# Patient Record
Sex: Female | Born: 1950 | Race: White | Hispanic: No | Marital: Married | Smoking: Never smoker
Health system: Southern US, Community
[De-identification: ages and names within clinical notes are randomized; demographics above are authoritative.]

## PROBLEM LIST (undated history)

## (undated) DIAGNOSIS — R079 Chest pain, unspecified: Secondary | ICD-10-CM

## (undated) DIAGNOSIS — D329 Benign neoplasm of meninges, unspecified: Secondary | ICD-10-CM

## (undated) DIAGNOSIS — D4709 Other mast cell neoplasms of uncertain behavior: Secondary | ICD-10-CM

## (undated) DIAGNOSIS — I10 Essential (primary) hypertension: Secondary | ICD-10-CM

## (undated) DIAGNOSIS — R9430 Abnormal result of cardiovascular function study, unspecified: Secondary | ICD-10-CM

## (undated) DIAGNOSIS — D229 Melanocytic nevi, unspecified: Secondary | ICD-10-CM

## (undated) HISTORY — DX: Other mast cell neoplasms of uncertain behavior: D47.09

## (undated) HISTORY — PX: CRANIOTOMY: SHX93

## (undated) HISTORY — PX: TOTAL ABDOMINAL HYSTERECTOMY: SHX209

## (undated) HISTORY — DX: Essential (primary) hypertension: I10

## (undated) HISTORY — PX: TEAR DUCT PROBING: SHX793

## (undated) HISTORY — DX: Benign neoplasm of meninges, unspecified: D32.9

## (undated) HISTORY — PX: TUBAL LIGATION: SHX77

---
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
## 1998-11-01 ENCOUNTER — Other Ambulatory Visit: Admission: RE | Admit: 1998-11-01 | Discharge: 1998-11-01 | Payer: Self-pay | Admitting: Gynecology

## 2000-01-09 ENCOUNTER — Other Ambulatory Visit: Admission: RE | Admit: 2000-01-09 | Discharge: 2000-01-09 | Payer: Self-pay | Admitting: Gynecology

## 2001-01-17 ENCOUNTER — Other Ambulatory Visit: Admission: RE | Admit: 2001-01-17 | Discharge: 2001-01-17 | Payer: Self-pay | Admitting: Gynecology

## 2003-04-01 ENCOUNTER — Other Ambulatory Visit: Admission: RE | Admit: 2003-04-01 | Discharge: 2003-04-01 | Payer: Self-pay | Admitting: Gynecology

## 2004-10-25 ENCOUNTER — Other Ambulatory Visit: Admission: RE | Admit: 2004-10-25 | Discharge: 2004-10-25 | Payer: Self-pay | Admitting: Gynecology

## 2005-11-14 ENCOUNTER — Other Ambulatory Visit: Admission: RE | Admit: 2005-11-14 | Discharge: 2005-11-14 | Payer: Self-pay | Admitting: Gynecology

## 2006-11-20 ENCOUNTER — Other Ambulatory Visit: Admission: RE | Admit: 2006-11-20 | Discharge: 2006-11-20 | Payer: Self-pay | Admitting: Gynecology

## 2007-11-25 ENCOUNTER — Other Ambulatory Visit: Admission: RE | Admit: 2007-11-25 | Discharge: 2007-11-25 | Payer: Self-pay | Admitting: Gynecology

## 2008-03-04 ENCOUNTER — Ambulatory Visit (HOSPITAL_COMMUNITY): Admission: RE | Admit: 2008-03-04 | Discharge: 2008-03-04 | Payer: Self-pay | Admitting: Gynecology

## 2008-07-01 ENCOUNTER — Encounter: Admission: RE | Admit: 2008-07-01 | Discharge: 2008-07-01 | Payer: Self-pay | Admitting: Gastroenterology

## 2008-10-07 ENCOUNTER — Ambulatory Visit: Payer: Self-pay | Admitting: Oncology

## 2008-10-20 LAB — HYPERCOAGULABLE PANEL, COMPREHENSIVE RET.
AntiThromb III Func: 124 % (ref 76–126)
Anticardiolipin IgA: <7 [APL'U] (ref <13)
Anticardiolipin IgG: <7 [GPL'U] (ref <11)
Anticardiolipin IgM: <7 [MPL'U] (ref <10)
Beta-2-Glycoprotein I IgA: <4 U/mL (ref <10)
Beta-2-Glycoprotein I IgM: <4 U/mL (ref <10)
PTT Lupus Anticoagulant: 42.8 secs (ref 36.3–48.8)
Protein S Activity: 117 % (ref 69–129)

## 2009-06-30 ENCOUNTER — Encounter: Admission: RE | Admit: 2009-06-30 | Discharge: 2009-06-30 | Payer: Self-pay | Admitting: Internal Medicine

## 2011-01-01 ENCOUNTER — Encounter: Payer: Self-pay | Admitting: Family Medicine

## 2012-01-15 DIAGNOSIS — D229 Melanocytic nevi, unspecified: Secondary | ICD-10-CM

## 2012-01-15 HISTORY — DX: Melanocytic nevi, unspecified: D22.9

## 2013-02-13 ENCOUNTER — Other Ambulatory Visit: Payer: Self-pay | Admitting: Interventional Cardiology

## 2013-02-19 ENCOUNTER — Inpatient Hospital Stay (HOSPITAL_BASED_OUTPATIENT_CLINIC_OR_DEPARTMENT_OTHER)
Admission: RE | Admit: 2013-02-19 | Discharge: 2013-02-19 | Disposition: A | Discharge status: Home / Self Care | Payer: Managed Care, Other (non HMO) | Source: Ambulatory Visit | Attending: Interventional Cardiology | Admitting: Interventional Cardiology

## 2013-02-19 ENCOUNTER — Encounter (HOSPITAL_BASED_OUTPATIENT_CLINIC_OR_DEPARTMENT_OTHER)
Admission: RE | Disposition: A | Discharge status: Home / Self Care | Payer: Self-pay | Source: Ambulatory Visit | Attending: Interventional Cardiology

## 2013-02-19 ENCOUNTER — Encounter (HOSPITAL_BASED_OUTPATIENT_CLINIC_OR_DEPARTMENT_OTHER): Payer: Self-pay | Admitting: Interventional Cardiology

## 2013-02-19 DIAGNOSIS — R9430 Abnormal result of cardiovascular function study, unspecified: Secondary | ICD-10-CM | POA: Insufficient documentation

## 2013-02-19 DIAGNOSIS — R079 Chest pain, unspecified: Secondary | ICD-10-CM

## 2013-02-19 DIAGNOSIS — R9439 Abnormal result of other cardiovascular function study: Secondary | ICD-10-CM | POA: Insufficient documentation

## 2013-02-19 HISTORY — DX: Chest pain, unspecified: R07.9

## 2013-02-19 HISTORY — DX: Abnormal result of cardiovascular function study, unspecified: R94.30

## 2013-02-19 SURGERY — JV LEFT HEART CATHETERIZATION WITH CORONARY ANGIOGRAM

## 2013-02-19 MED ORDER — SODIUM CHLORIDE 0.9 % IJ SOLN: 3.0000 mL | Freq: Two times a day (BID) | INTRAMUSCULAR | Status: DC

## 2013-02-19 MED ORDER — ONDANSETRON HCL 4 MG/2ML IJ SOLN
4.0000 mg | Freq: Four times a day (QID) | INTRAMUSCULAR | Status: DC | PRN
Start: 2013-02-19 — End: 2013-02-19

## 2013-02-19 MED ORDER — DIPHENHYDRAMINE HCL 50 MG/ML IJ SOLN
25.0000 mg | Freq: Once | INTRAMUSCULAR | Status: AC
  Administered 2013-02-19: 25 mg via INTRAVENOUS

## 2013-02-19 MED ORDER — FAMOTIDINE IN NACL 20-0.9 MG/50ML-% IV SOLN
20.0000 mg | Freq: Two times a day (BID) | INTRAVENOUS | Status: DC
  Administered 2013-02-19: 20 mg via INTRAVENOUS

## 2013-02-19 MED ORDER — SODIUM CHLORIDE 0.9 % IV SOLN: INTRAVENOUS | Status: DC

## 2013-02-19 MED ORDER — SODIUM CHLORIDE 0.9 % IV SOLN: 250.0000 mL | INTRAVENOUS | Status: DC | PRN

## 2013-02-19 MED ORDER — DIAZEPAM 5 MG PO TABS
5.0000 mg | ORAL_TABLET | ORAL | Status: AC
  Administered 2013-02-19: 5 mg via ORAL

## 2013-02-19 MED ORDER — SODIUM CHLORIDE 0.9 % IJ SOLN: 3.0000 mL | INTRAMUSCULAR | Status: DC | PRN

## 2013-02-19 MED ORDER — ACETAMINOPHEN 325 MG PO TABS: 650.0000 mg | ORAL_TABLET | ORAL | Status: DC | PRN

## 2013-02-19 MED ORDER — ASPIRIN 81 MG PO CHEW
324.0000 mg | CHEWABLE_TABLET | ORAL | Status: AC
  Administered 2013-02-19: 324 mg via ORAL

## 2013-02-19 NOTE — CV Procedure (Addendum)
PROCEDURE:  Left heart catheterization with selective coronary angiography, left ventriculogram. Abdominal aortogram.  INDICATIONS:  Abnormal stress test  The risks, benefits, and details of the procedure were explained to the patient.  The patient verbalized understanding and wanted to proceed.  Informed written consent was obtained.  PROCEDURE TECHNIQUE:  After Xylocaine anesthesia a 75F sheath was placed in the right femoral artery with a single anterior needle wall stick.   Left coronary angiography was done using a Judkins L4 guide catheter.  Right coronary angiography was done using a 3DRC guide catheter.  Left ventriculography was done using a pigtail catheter.    CONTRAST:  Total of 80 cc.  COMPLICATIONS:  None.    HEMODYNAMICS:  Aortic pressure was 135/73; LV pressure was 143/13; LVEDP 15.  There was no gradient between the left ventricle and aorta.    ANGIOGRAPHIC DATA:   The left main coronary artery is widely patent.  The left anterior descending artery is a large vessel which reaches the apex.  There are two diagonal vessels which are medium sized and widely patent.  There is a branch from the proximal LAD that feeds an accessory vessel which apparently has a fistula to the pulmonary artery.  The left circumflex artery is a large vessel which is angiographically normal.  OM1 is large and angiographically normal.  The right coronary artery is large dominant vessel which is angiographically normal.  THere is a large RV marginal branch which apparently feeds a fistula to the pulmonary artery.  LEFT VENTRICULOGRAM:  Left ventricular angiogram was done in the 30 RAO projection and revealed normal left ventricular wall motion and systolic function with an estimated ejection fraction of 60%.  LVEDP was 15 mmHg.  ABDOMINAL AORTOGRAM: No AAA.  Mild aortic atherosclerosis.  Dual arterial supply to the right kidney.  Single left renal artery which is widely  patent.  IMPRESSIONS:  1. Normal left main coronary artery. 2. Normal left anterior descending artery and its branches. 3. Normal left circumflex artery and its branches. 4. Normal right coronary artery. 5. Normal left ventricular systolic function.  LVEDP 15 mmHg.  Ejection fraction 60%. 6.  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    RECOMMENDATION:  Primary prevention.  Will plan for CT angio to evaluate accessory coronary vessels.  If there was significant flow to the PA fistula from the right coronary artery, that could cause angina and possibly an inferior wall defect on stress echo as seen on her stress test.

## 2013-02-19 NOTE — H&P (Signed)
Date of Initial H&P: 02/13/13  History reviewed, patient examined, no change in status, stable for surgery.

## 2013-02-21 ENCOUNTER — Other Ambulatory Visit: Payer: Self-pay | Admitting: Interventional Cardiology

## 2013-02-21 DIAGNOSIS — R079 Chest pain, unspecified: Secondary | ICD-10-CM

## 2013-02-24 ENCOUNTER — Ambulatory Visit (HOSPITAL_COMMUNITY)
Admission: RE | Admit: 2013-02-24 | Discharge: 2013-02-24 | Disposition: A | Discharge status: Home / Self Care | Payer: Managed Care, Other (non HMO) | Source: Ambulatory Visit | Attending: Interventional Cardiology | Admitting: Interventional Cardiology

## 2013-02-24 DIAGNOSIS — R079 Chest pain, unspecified: Secondary | ICD-10-CM

## 2013-02-24 DIAGNOSIS — Q249 Congenital malformation of heart, unspecified: Secondary | ICD-10-CM

## 2013-02-24 DIAGNOSIS — R918 Other nonspecific abnormal finding of lung field: Secondary | ICD-10-CM | POA: Insufficient documentation

## 2013-02-24 MED ORDER — NITROGLYCERIN 0.4 MG SL SUBL
SUBLINGUAL_TABLET | SUBLINGUAL | Status: AC
  Filled 2013-02-24: qty 25

## 2013-02-24 MED ORDER — DIPHENHYDRAMINE HCL 50 MG/ML IJ SOLN
INTRAMUSCULAR | Status: AC
  Filled 2013-02-24: qty 1

## 2013-02-24 MED ORDER — DIPHENHYDRAMINE HCL 50 MG/ML IJ SOLN
25.0000 mg | Freq: Once | INTRAMUSCULAR | Status: AC
  Administered 2013-02-24: 25 mg via INTRAVENOUS

## 2013-02-24 MED ORDER — METOPROLOL TARTRATE 1 MG/ML IV SOLN
INTRAVENOUS | Status: AC
  Administered 2013-02-24: 5 mg
  Filled 2013-02-24: qty 5

## 2013-02-24 MED ORDER — METOPROLOL TARTRATE 1 MG/ML IV SOLN
5.0000 mg | INTRAVENOUS | Status: DC | PRN
  Administered 2013-02-24: 5 mg via INTRAVENOUS

## 2013-02-24 MED ORDER — METHYLPREDNISOLONE SODIUM SUCC 40 MG IJ SOLR
INTRAMUSCULAR | Status: AC
  Filled 2013-02-24: qty 1

## 2013-02-24 MED ORDER — METHYLPREDNISOLONE SODIUM SUCC 40 MG IJ SOLR
30.0000 mg | Freq: Once | INTRAMUSCULAR | Status: AC
  Administered 2013-02-24: 30 mg via INTRAVENOUS

## 2013-02-24 MED ORDER — METOPROLOL TARTRATE 1 MG/ML IV SOLN
INTRAVENOUS | Status: AC
  Administered 2013-02-24: 5 mg via INTRAVENOUS
  Filled 2013-02-24: qty 15

## 2013-02-24 MED ORDER — IOHEXOL 350 MG/ML SOLN
100.0000 mL | Freq: Once | INTRAVENOUS | Status: AC | PRN
  Administered 2013-02-24: 100 mL via INTRAVENOUS

## 2013-02-28 ENCOUNTER — Other Ambulatory Visit: Payer: Self-pay | Admitting: Interventional Cardiology

## 2013-03-03 ENCOUNTER — Inpatient Hospital Stay (HOSPITAL_BASED_OUTPATIENT_CLINIC_OR_DEPARTMENT_OTHER)
Admission: RE | Admit: 2013-03-03 | Discharge: 2013-03-03 | Disposition: A | Discharge status: Home / Self Care | Payer: Managed Care, Other (non HMO) | Source: Ambulatory Visit | Attending: Interventional Cardiology | Admitting: Interventional Cardiology

## 2013-03-03 ENCOUNTER — Encounter (HOSPITAL_BASED_OUTPATIENT_CLINIC_OR_DEPARTMENT_OTHER)
Admission: RE | Disposition: A | Discharge status: Home / Self Care | Payer: Self-pay | Source: Ambulatory Visit | Attending: Interventional Cardiology

## 2013-03-03 DIAGNOSIS — R079 Chest pain, unspecified: Secondary | ICD-10-CM | POA: Insufficient documentation

## 2013-03-03 DIAGNOSIS — R9430 Abnormal result of cardiovascular function study, unspecified: Secondary | ICD-10-CM

## 2013-03-03 DIAGNOSIS — I772 Rupture of artery: Secondary | ICD-10-CM | POA: Insufficient documentation

## 2013-03-03 DIAGNOSIS — R9389 Abnormal findings on diagnostic imaging of other specified body structures: Secondary | ICD-10-CM | POA: Insufficient documentation

## 2013-03-03 DIAGNOSIS — M79609 Pain in unspecified limb: Secondary | ICD-10-CM | POA: Insufficient documentation

## 2013-03-03 LAB — POCT I-STAT 3, VENOUS BLOOD GAS (G3P V)
Acid-base deficit: 1 mmol/L (ref 0.0–2.0)
Acid-base deficit: 1 mmol/L (ref 0.0–2.0)
Acid-base deficit: 3 mmol/L — ABNORMAL HIGH (ref 0.0–2.0)
Bicarbonate: 23.3 mEq/L (ref 20.0–24.0)
Bicarbonate: 23.7 mEq/L (ref 20.0–24.0)
Bicarbonate: 23.9 mEq/L (ref 20.0–24.0)
O2 Saturation: 69 %
O2 Saturation: 72 %
O2 Saturation: 72 %
O2 Saturation: 74 %
O2 Saturation: 75 %
TCO2: 23 mmol/L (ref 0–100)
TCO2: 24 mmol/L (ref 0–100)
TCO2: 25 mmol/L (ref 0–100)
pCO2, Ven: 36.4 mmHg — ABNORMAL LOW (ref 45.0–50.0)
pCO2, Ven: 36.6 mmHg — ABNORMAL LOW (ref 45.0–50.0)
pCO2, Ven: 38.1 mmHg — ABNORMAL LOW (ref 45.0–50.0)
pCO2, Ven: 39 mmHg — ABNORMAL LOW (ref 45.0–50.0)
pH, Ven: 7.395 — ABNORMAL HIGH (ref 7.250–7.300)
pH, Ven: 7.4 — ABNORMAL HIGH (ref 7.250–7.300)
pH, Ven: 7.402 — ABNORMAL HIGH (ref 7.250–7.300)
pO2, Ven: 36 mmHg (ref 30.0–45.0)
pO2, Ven: 38 mmHg (ref 30.0–45.0)
pO2, Ven: 38 mmHg (ref 30.0–45.0)
pO2, Ven: 40 mmHg (ref 30.0–45.0)

## 2013-03-03 SURGERY — JV RIGHT HEART CATHETERIZATION
Anesthesia: Moderate Sedation

## 2013-03-03 MED ORDER — SODIUM CHLORIDE 0.9 % IJ SOLN: 3.0000 mL | Freq: Two times a day (BID) | INTRAMUSCULAR | Status: DC

## 2013-03-03 MED ORDER — ACETAMINOPHEN 325 MG PO TABS: 650.0000 mg | ORAL_TABLET | ORAL | Status: DC | PRN

## 2013-03-03 MED ORDER — SODIUM CHLORIDE 0.9 % IJ SOLN: 3.0000 mL | INTRAMUSCULAR | Status: DC | PRN

## 2013-03-03 MED ORDER — SODIUM CHLORIDE 0.9 % IV SOLN: 250.0000 mL | INTRAVENOUS | Status: DC | PRN

## 2013-03-03 MED ORDER — ONDANSETRON HCL 4 MG/2ML IJ SOLN: 4.0000 mg | Freq: Four times a day (QID) | INTRAMUSCULAR | Status: DC | PRN

## 2013-03-03 NOTE — OR Nursing (Signed)
Dr Varanasi at bedside to discuss results and treatment plan with pt and family 

## 2013-03-03 NOTE — H&P (Signed)
  Date of Initial H&P: 02/13/13  History reviewed, patient examined, no change in status, stable for surgery.

## 2013-03-03 NOTE — OR Nursing (Signed)
Meal served 

## 2013-03-03 NOTE — CV Procedure (Signed)
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  The patient verbalized understanding and wanted to proceed.  Informed written consent was obtained.  PROCEDURE TECHNIQUE:  After Xylocaine anesthesia a 95F sheath was placed in the right femoral vein with a single anterior needle wall stick.   A Swan-Ganz catheter was used to obtain a hemodynamic pressures assessment as well as multiple different oxygen saturation samples from the right atrium, right ventricle, main pulmonary artery, left pulmonary artery and right pulmonary artery.    CONTRAST:  Total of 0 cc.  COMPLICATIONS:  None.    HEMODYNAMICS:  Right atrial pressure 8/6, mean radiation pressure 6 mm Hg; RV pressure 34/2, RVEDP 7 mm Hg; pulmonary artery 32/11, mean pulmonary artery pressure 20 mm Hg, pulmonary capillary wedge pressure 14/11, mean pulmonary capillary wedge pressure 10 mm Hg, main pulmonary artery saturation 73%, right pulmonary artery saturation 74%, left pulmonary artery saturation 74%, right atrial saturation 73%, right ventricle saturation 69%.  Cardiac output 4.8 L per minute.  Cardiac index 2.4.    IMPRESSIONS:  1. 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 2.  No evidence of pulmonary hypertension.  RECOMMENDATION:  Will send her test results to Dr. Romeo Apple at [facility].  She did have an abnormal stress echo with an inferior wall motion abnormality done there at [facility].  We'll have to make a decision whether or not the success rate arteries will need to be ligated.  At rest, there does not seem to be any significant shunt.

## 2013-03-03 NOTE — OR Nursing (Signed)
Tegaderm dressing applied, site level 0, bedrest begins at 1215 

## 2013-06-05 ENCOUNTER — Ambulatory Visit (INDEPENDENT_AMBULATORY_CARE_PROVIDER_SITE_OTHER): Payer: Managed Care, Other (non HMO) | Admitting: Internal Medicine

## 2013-06-05 ENCOUNTER — Encounter: Payer: Self-pay | Admitting: Internal Medicine

## 2013-06-05 VITALS — BP 150/98 | HR 98 | Temp 98.1°F | Ht 68.5 in | Wt 201.8 lb

## 2013-06-05 DIAGNOSIS — R0600 Dyspnea, unspecified: Secondary | ICD-10-CM

## 2013-06-05 DIAGNOSIS — R911 Solitary pulmonary nodule: Secondary | ICD-10-CM

## 2013-06-05 DIAGNOSIS — R0609 Other forms of dyspnea: Secondary | ICD-10-CM

## 2013-06-05 NOTE — Progress Notes (Signed)
Subjective:    Patient ID: April Summers, female    DOB: 1950/12/13, 62 y.o.   MRN: [id number] PCP FRIED, Doris Cheadle, MD Cards is Everette Rank  HPI 62 year old female. Never smoker. Her mom is Loura Halt patient with amio toxicity of Dr Marchelle Gearing   IN 2010 dx meningioma x  3. S/p resection x 1 in Jan 2011. Rest two; on observation    Says in  Nov 2013: while in an MRI suite at [facility] for known meniongioma she developed tachycaria. She then realized she had hx of flushing x 6 years. 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 And started on cromolyn and benadryl. Then in March 2014 underwent cardiac cath (had failed cardiac stress test months earlire) by Eldridge Dace [place] and  this showed co artery anamoly. Therefore had CT coronary CT chest 02/24/13 here in [place] and the lung compomnt showed pulmonary nodule 4mm in RML (see below). So, she called her Dr Reginia Naas at [facility] who is a GI oncologist who specializes in carcinoids and was told she needed pulmonary evalation   Currnetly only resp symptom is dyspne x 1 year. Insidiius onset, STable.Ass with fatigue . SHe feels is due to mast cell activation. ORal cromolyn not helping with dyspnea though it helps with flushing and tachycadia. She is not interested in dyspnea workup   Of note, through all this diagnosed in April 2014 with thyrpid nodules x 3 on each side. Currently on monitoring with plans for possible needle aspiration   Findings:  IMPRESSION:  1. 4 mm nodule in the lateral segment of the right middle lobe.  This is a nonspecific finding. If the patient is at high risk for  bronchogenic carcinoma, follow-up chest CT at 1 year is  recommended. If the patient is at low risk, no follow-up is  needed. This recommendation follows the consensus statement:  Guidelines for Management of Small Pulmonary Nodules Detected on CT  Scans: A Statement from  the [employer] Society as published in  Radiology 2005; 237:395-400.  2. Calcified granuloma in the right lower lobe incidentally noted.  **END ADDENDUM** SIGNED BY: Florencia Reasons, M.D     Past Medical History  Diagnosis Date  . Nonspecific abnormal unspecified cardiovascular function study   . Chest pain, unspecified   . HTN (hypertension)   . Mast cell disorder   . Benign meningioma      Family History  Problem Relation Age of Onset  . Heart disease Father   . Clotting disorder Brother      History   Social History  . Marital Status: Married    Spouse Name: N/A    Number of Children: N/A  . Years of Education: N/A   Occupational History  . Not on file.   Social History Main Topics  . Smoking status: Never Smoker   . Smokeless tobacco: Not on file  . Alcohol Use: No  . Drug Use: No  . Sexually Active: Not on file   Other Topics Concern  . Not on file   Social History Narrative  . No narrative on file     Allergies  Allergen Reactions  . Lidocaine Hcl Palpitations  . Blue Dyes (Parenteral)     Mouth blisters  . Ciprofloxacin Other (See Comments)    Wheezing  . Doxycycline Other (See Comments)    Burning feeling all over  . Imdur (  Isosorbide)     Long acting causes migraines  . Nitrostat (Nitroglycerin)     Long acting cause migraines  . Red Dye     Mouth blisters  . Benzocaine Palpitations     No outpatient prescriptions prior to visit.   No facility-administered medications prior to visit.       Review of Systems  Constitutional: Negative for fever and unexpected weight change.  HENT: Negative for ear pain, nosebleeds, congestion, sore throat, rhinorrhea, sneezing, trouble swallowing, dental problem, postnasal drip and sinus pressure.   Eyes: Negative for redness and itching.  Respiratory: Positive for shortness of breath. Negative for cough, chest tightness and wheezing.   Cardiovascular: Negative for palpitations and leg swelling.   Gastrointestinal: Negative for nausea and vomiting.  Genitourinary: Negative for dysuria.  Musculoskeletal: Negative for joint swelling.  Skin: Negative for rash.  Neurological: Negative for headaches.  Hematological: Does not bruise/bleed easily.  Psychiatric/Behavioral: Negative for dysphoric mood. The patient is not nervous/anxious.        Objective:   Physical Exam  Vitals reviewed. Constitutional: She is oriented to person, place, and time. She appears well-developed and well-nourished. No distress.  HENT:  Head: Normocephalic and atraumatic.  Right Ear: External ear normal.  Left Ear: External ear normal.  Mouth/Throat: Oropharynx is clear and moist. No oropharyngeal exudate.  Eyes: Conjunctivae and EOM are normal. Pupils are equal, round, and reactive to light. Right eye exhibits no discharge. Left eye exhibits no discharge. No scleral icterus.  Neck: Normal range of motion. Neck supple. No JVD present. No tracheal deviation present. No thyromegaly present.  Cardiovascular: Normal rate, regular rhythm, normal heart sounds and intact distal pulses.  Exam reveals no gallop and no friction rub.   No murmur heard. Pulmonary/Chest: Effort normal and breath sounds normal. No respiratory distress. She has no wheezes. She has no rales. She exhibits no tenderness.  Abdominal: Soft. Bowel sounds are normal. She exhibits no distension and no mass. There is no tenderness. There is no rebound and no guarding.  Musculoskeletal: Normal range of motion. She exhibits no edema and no tenderness.  Lymphadenopathy:    She has no cervical adenopathy.  Neurological: She is alert and oriented to person, place, and time. She has normal reflexes. No cranial nerve deficit. She exhibits normal muscle tone. Coordination normal.  Skin: Skin is warm and dry. No rash noted. She is not diaphoretic. There is erythema. No pallor.  Psychiatric: She has a normal mood and affect. Her behavior is normal. Judgment  and thought content normal.          Assessment & Plan:

## 2013-06-05 NOTE — Patient Instructions (Addendum)
#  Shortness of breath  - will watch it for now - if bothersome or worrisome or worse, call and return sooner  #Lung ndoule - 4mm RML in [facility] 2014  - Repeat scan in 1 year which is [facility] 2015  #Followup - March 2015 after CT chest

## 2013-06-08 DIAGNOSIS — R0600 Dyspnea, unspecified: Secondary | ICD-10-CM | POA: Insufficient documentation

## 2013-06-08 DIAGNOSIS — R911 Solitary pulmonary nodule: Secondary | ICD-10-CM | POA: Insufficient documentation

## 2013-06-08 NOTE — Assessment & Plan Note (Signed)
She has exertional dyspnea in setting of mast cell activation disorder but she is not interested in workup

## 2013-06-08 NOTE — Assessment & Plan Note (Signed)
RML 4mm nodule Non smoker. Seen Coronory CT March 2014. Discussed ddx. Plan repeat CT chest March 2015

## 2014-02-27 ENCOUNTER — Other Ambulatory Visit: Payer: Managed Care, Other (non HMO)

## 2014-03-17 ENCOUNTER — Encounter (HOSPITAL_BASED_OUTPATIENT_CLINIC_OR_DEPARTMENT_OTHER): Payer: Self-pay

## 2014-03-17 ENCOUNTER — Ambulatory Visit (HOSPITAL_BASED_OUTPATIENT_CLINIC_OR_DEPARTMENT_OTHER)
Admission: RE | Admit: 2014-03-17 | Discharge: 2014-03-17 | Disposition: A | Discharge status: Home / Self Care | Payer: Managed Care, Other (non HMO) | Source: Ambulatory Visit | Attending: Internal Medicine | Admitting: Internal Medicine

## 2014-03-17 ENCOUNTER — Other Ambulatory Visit: Payer: Self-pay

## 2014-03-17 DIAGNOSIS — R911 Solitary pulmonary nodule: Secondary | ICD-10-CM

## 2014-03-25 ENCOUNTER — Telehealth: Payer: Self-pay | Admitting: Internal Medicine

## 2014-03-25 NOTE — Telephone Encounter (Signed)
Ct chest 03/17/14 stable nodule.. AGain she was supposed to see me after Ct ni march 2015 but there is no fu office visit   Dr. Brand Males, M.D., [facility].C.P Pulmonary and Critical Care Medicine Staff Physician [facility] Pager: [phone], If no answer or between  15:00h - 7:00h: call [phone]  03/25/2014 9:36 PM

## 2014-03-26 NOTE — Telephone Encounter (Signed)
Are there any days on your schedule that we can double book you? You are 100% for the next 3-4 weeks.  Pt aware that we will contact her once we have an appt date and time that will work  Please advise Dr Chase Caller. Thanks.

## 2014-03-27 NOTE — Telephone Encounter (Signed)
Spoke with pt and offered pt appt today to f/u on chest ct.  Pt states she cannot come today.  Appt given on 04/28/14 at 9:45 with MR.  Pt requested copy of chest ct be mailed to her home so she can take this to her appt at [facility].  Address verified and copy mailed to pt.

## 2014-03-27 NOTE — Telephone Encounter (Signed)
  First available whenever that is is fine

## 2014-04-28 ENCOUNTER — Ambulatory Visit: Payer: Managed Care, Other (non HMO) | Admitting: Internal Medicine

## 2014-05-12 ENCOUNTER — Encounter: Payer: Self-pay | Admitting: Internal Medicine

## 2014-05-12 ENCOUNTER — Ambulatory Visit (INDEPENDENT_AMBULATORY_CARE_PROVIDER_SITE_OTHER): Payer: Managed Care, Other (non HMO) | Admitting: Internal Medicine

## 2014-05-12 VITALS — BP 132/72 | HR 69 | Ht 68.0 in | Wt 201.4 lb

## 2014-05-12 DIAGNOSIS — R0609 Other forms of dyspnea: Secondary | ICD-10-CM

## 2014-05-12 DIAGNOSIS — R911 Solitary pulmonary nodule: Secondary | ICD-10-CM

## 2014-05-12 DIAGNOSIS — R0989 Other specified symptoms and signs involving the circulatory and respiratory systems: Secondary | ICD-10-CM

## 2014-05-12 DIAGNOSIS — R0600 Dyspnea, unspecified: Secondary | ICD-10-CM

## 2014-05-12 NOTE — Patient Instructions (Addendum)
#  Shortness of breath  -glad is resolved  #Lung ndoule  - 50mm RML/RUL in [facility] 2014 and unchanged in April 2015  - new nodule seen 66mm in Right middle lobe  - Repeat scan in 2 year which is [facility] 2017  #Followup - March 2017 after CT chest

## 2014-05-12 NOTE — Progress Notes (Signed)
Subjective:    Patient ID: April Summers, female    DOB: 1951-08-20, 63 y.o.   MRN: [id number]  HPI    OV 05/12/2014  Chief Complaint  Patient presents with  . Follow-up    Pt here to discuss CT results. Denies cough, SOB and CP. Pt overall is doing well.     Followup pulmonary nodule:  4 mm right upper lobe/right middle lobe. Had CT scan of the chest April 2015. This nodule is no unchanged compared to one year ago in 2014. In fact it is noncalcified. However she is a new 2 mm right middle lobe pulmonary nodule. I advised that she does not need follow up for this but if she is anxious about this/ She prefers a followup CT scan of the chest in 2 years. She is a nonsmoker. Her dyspnea is resolved and there are no new issues. She is completely asymptomatic.   COMPARISON: CT HEART MORP W/CTA COR W/SCORE W/CA W/CM &/OR WO/CM  dated 02/24/2013  FINDINGS:  There is a 4 mm calcified lung nodule in the anterior inferior  aspect of the right upper lobe. It is stable from 02/24/2013. It is  associated with minimal surrounding fibrosis. This is seen best on  image number 37. On 49 in the right middle lobe there is a 2 mm  pulmonary nodule which is not noted on the prior study. The right  lung is otherwise clear. Left lung is clear except for minimal  subsegmental atelectasis in the inferolateral lingula.  No pleural or pericardial effusion. No significant adenopathy.  Minimal aortic arch calcification. Scans through the upper abdomen  unremarkable. No acute osseous abnormalities.  IMPRESSION:  Stable 4 mm pulmonary nodule, with 12 month stability suggesting  benign etiology, a conclusion further supported by the calcification  of the nodule. There is a new 2 mm nodule on the right. If the  patient is at high risk for bronchogenic carcinoma, follow-up chest  CT at 1 year for this new nodule is recommended. If the patient is  at low risk, no follow-up is needed. This recommendation follows  the  consensus statement: Guidelines for Management of Small Pulmonary  Nodules Detected on CT Scans: A Statement from the [employer] as published in Radiology 2005; 237:395-400.  Electronically Signed  By: Skipper Cliche M.D.  On: 03/17/2014 10:14   Review of Systems  Constitutional: Negative for fever and unexpected weight change.  HENT: Negative for congestion, dental problem, ear pain, nosebleeds, postnasal drip, rhinorrhea, sinus pressure, sneezing, sore throat and trouble swallowing.   Eyes: Negative for redness and itching.  Respiratory: Negative for cough, chest tightness, shortness of breath and wheezing.   Cardiovascular: Negative for palpitations and leg swelling.  Gastrointestinal: Negative for nausea and vomiting.  Genitourinary: Negative for dysuria.  Musculoskeletal: Negative for joint swelling.  Skin: Negative for rash.  Neurological: Negative for headaches.  Hematological: Does not bruise/bleed easily.  Psychiatric/Behavioral: Negative for dysphoric mood. The patient is not nervous/anxious.        Objective:   Physical Exam  Vitals reviewed. Constitutional: She is oriented to person, place, and time. She appears well-developed and well-nourished. No distress.  HENT:  Head: Normocephalic and atraumatic.  Right Ear: External ear normal.  Left Ear: External ear normal.  Mouth/Throat: Oropharynx is clear and moist. No oropharyngeal exudate.  Eyes: Conjunctivae and EOM are normal. Pupils are equal, round, and reactive to light. Right eye exhibits no discharge. Left eye exhibits no discharge. No scleral  icterus.  Neck: Normal range of motion. Neck supple. No JVD present. No tracheal deviation present. No thyromegaly present.  Cardiovascular: Normal rate, regular rhythm, normal heart sounds and intact distal pulses.  Exam reveals no gallop and no friction rub.   No murmur heard. Pulmonary/Chest: Effort normal and breath sounds normal. No respiratory distress.  She has no wheezes. She has no rales. She exhibits no tenderness.  Abdominal: Soft. Bowel sounds are normal. She exhibits no distension and no mass. There is no tenderness. There is no rebound and no guarding.  Musculoskeletal: Normal range of motion. She exhibits no edema and no tenderness.  Lymphadenopathy:    She has no cervical adenopathy.  Neurological: She is alert and oriented to person, place, and time. She has normal reflexes. No cranial nerve deficit. She exhibits normal muscle tone. Coordination normal.  Skin: Skin is warm and dry. No rash noted. She is not diaphoretic. No erythema. No pallor.  Psychiatric: She has a normal mood and affect. Her behavior is normal. Judgment and thought content normal.          Assessment & Plan:  #Shortness of breath  -glad is resolved  #Lung ndoule  - 65mm RML/RUL in [facility] 2014 and unchanged in April 2015  - new nodule seen 61mm in Right middle lobe  - Repeat scan in 2 year which is [facility] 2017  #Followup - March 2017 after CT chest

## 2014-05-22 NOTE — Assessment & Plan Note (Signed)
#  Shortness of breath  -glad is resolved

## 2014-05-22 NOTE — Assessment & Plan Note (Signed)
#  Lung ndoule  - 47mm RML/RUL in [facility] 2014 and unchanged in April 2015  - new nodule seen 15mm in Right middle lobe  - Repeat scan in 2 year which is [facility] 2017  #Followup - March 2017 after CT chest

## 2014-06-02 ENCOUNTER — Other Ambulatory Visit: Payer: Self-pay | Admitting: Physician Assistant

## 2014-09-08 ENCOUNTER — Encounter: Payer: Self-pay | Admitting: *Deleted

## 2015-08-19 IMAGING — CT CT CHEST W/O CM
2 of 3 series · 15 of 36 positions shown, 18 images · non-contrast
Comparison: CT HEART MORP W/CTA COR W/SCORE W/CA W/CM &/OR WO/CM
dated 02/24/2013

CLINICAL DATA: 4 mm right lung nodule

EXAM:
CT CHEST WITHOUT CONTRAST
TECHNIQUE: Multidetector CT imaging of the chest was performed following the
standard protocol without IV contrast.

[Series 2: chest 5.0 b31f · axial · 0.77mm/px · z∈[-111,+139]mm · 12 of 60 slices shown, 15 images]
[im 5/60  mediastinal]
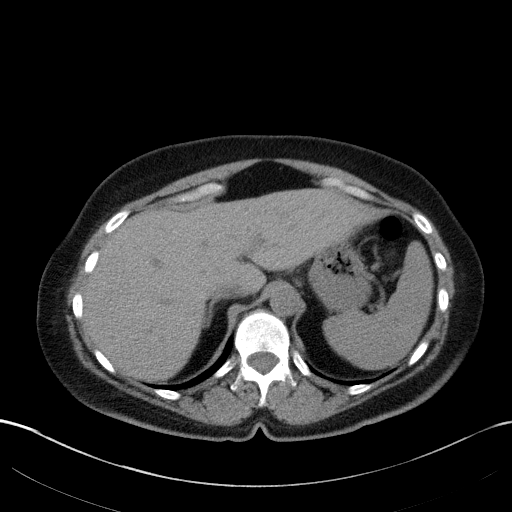
[im 5/60  lung]
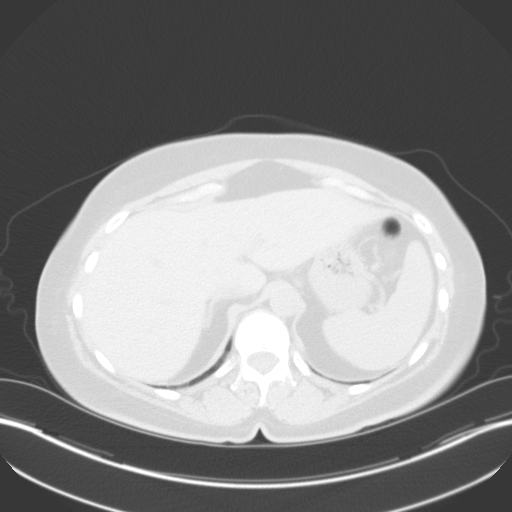
[im 9/60  lung]
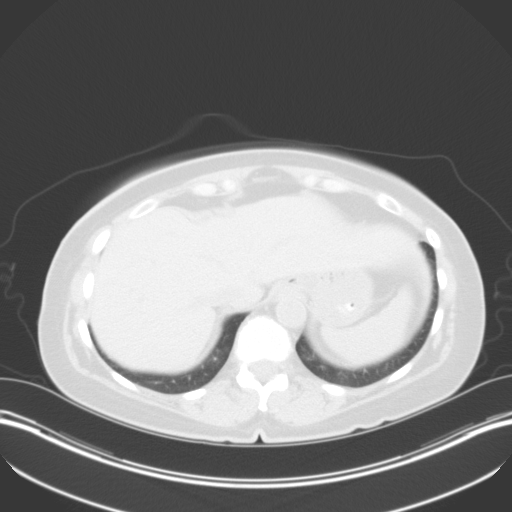
[im 14/60  lung]
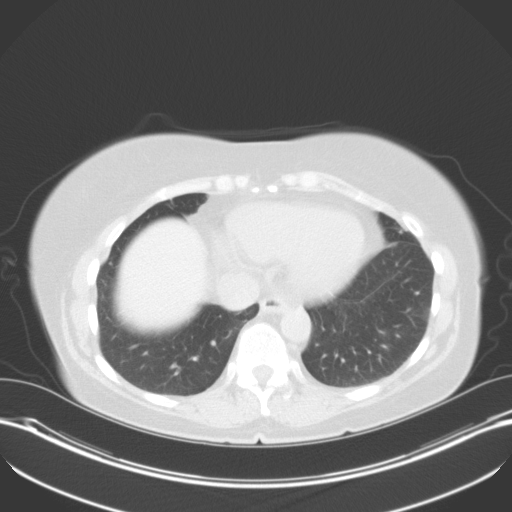
[im 18/60  lung]
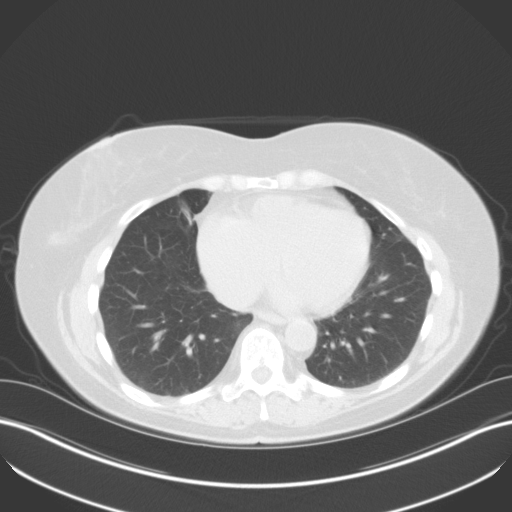
[im 22/60  mediastinal]
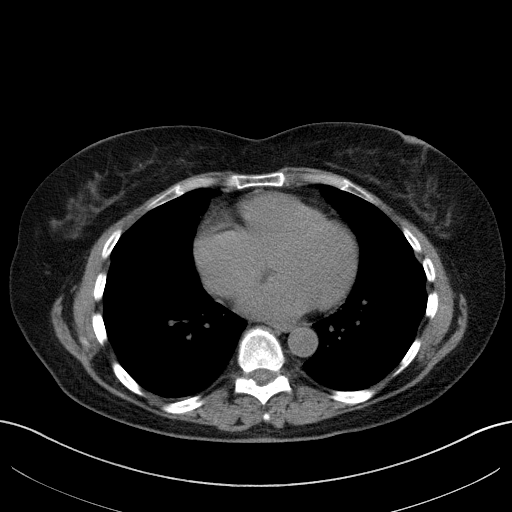
[im 22/60  lung]
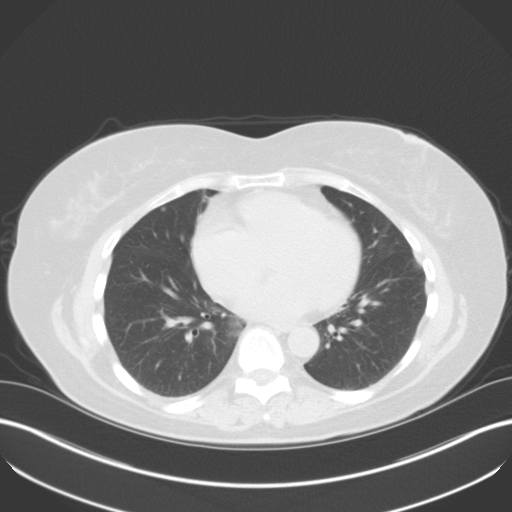
[im 27/60  lung]
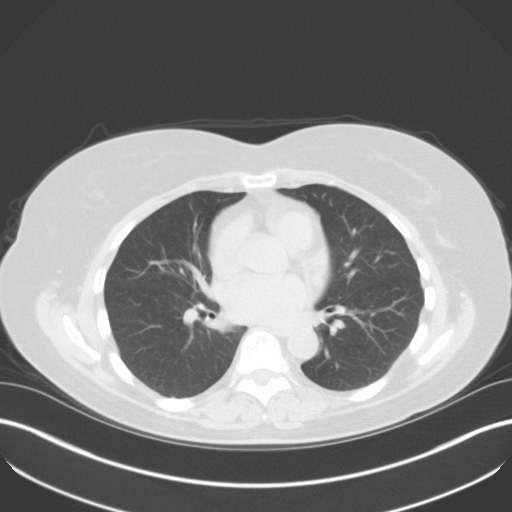
[im 33/60  lung]
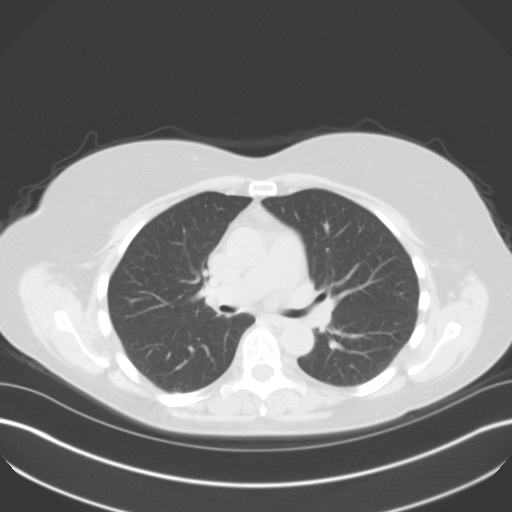
[im 38/60  lung]
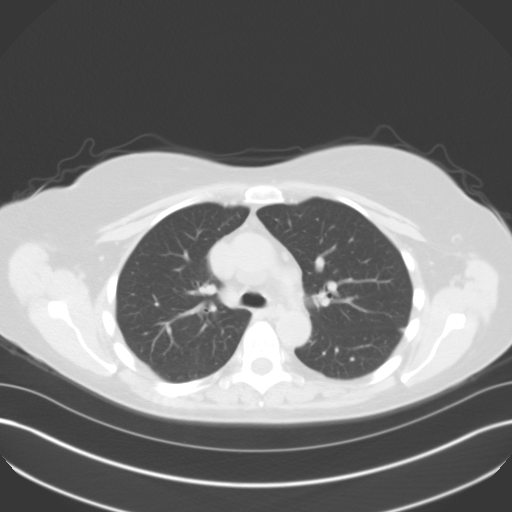
[im 42/60  mediastinal]
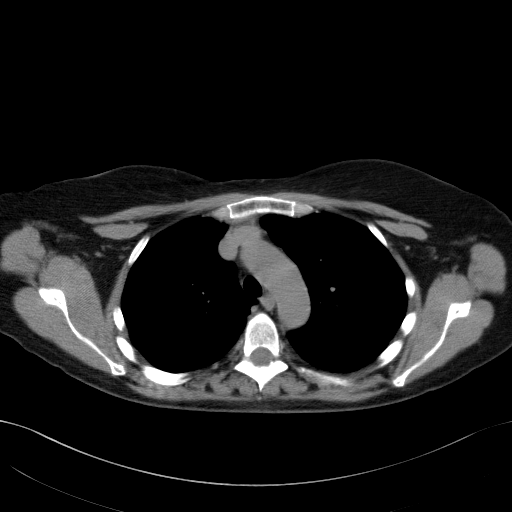
[im 42/60  lung]
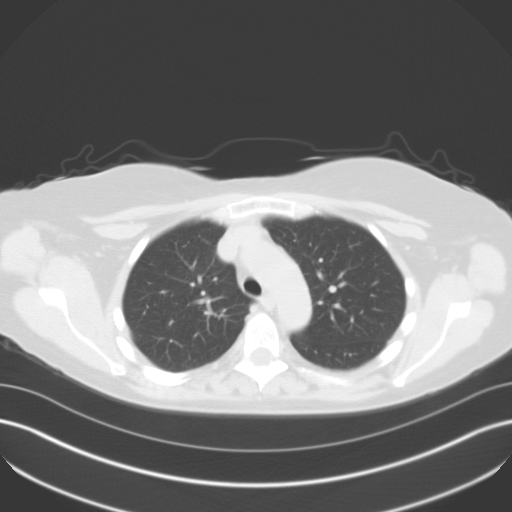
[im 46/60  lung]
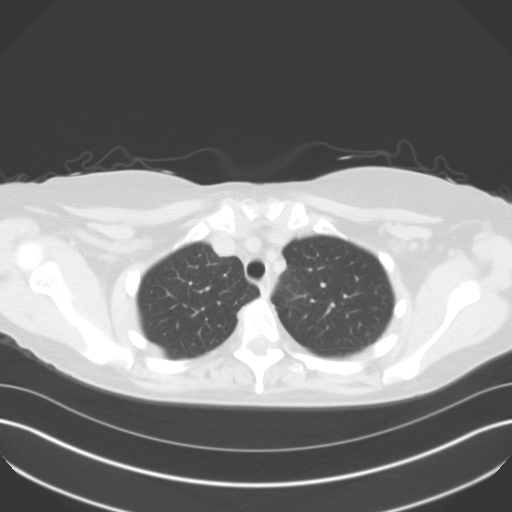
[im 51/60  lung]
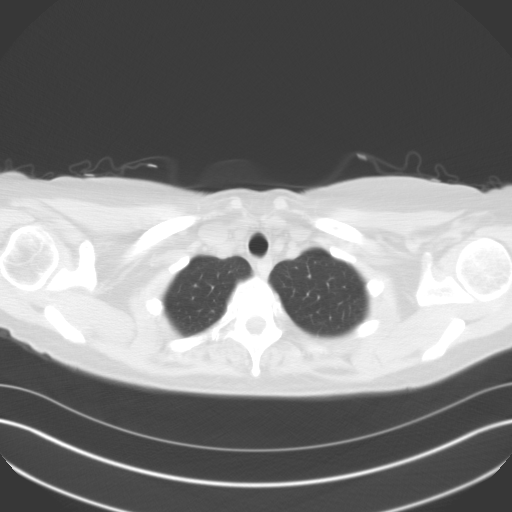
[im 55/60  lung]
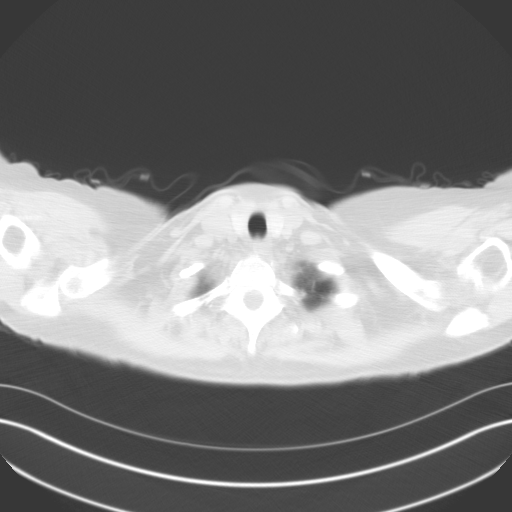

[Series 6: chest 3.0 coronal · coronal · 0.59mm/px · 3 of 83 slices shown]
[im 17/83  lung]
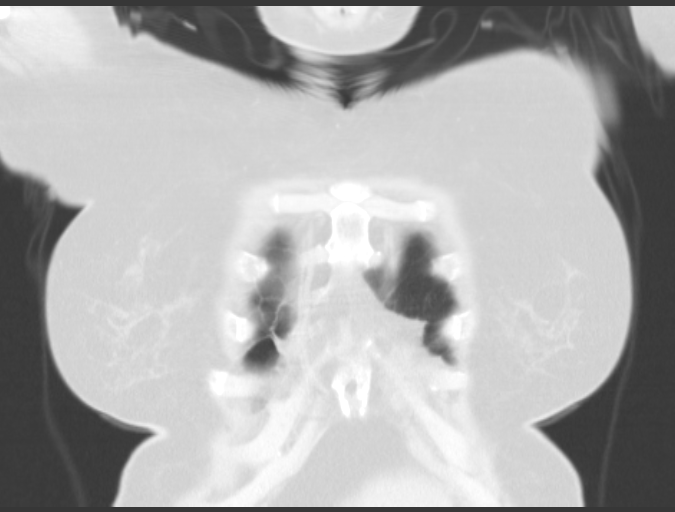
[im 33/83  lung]
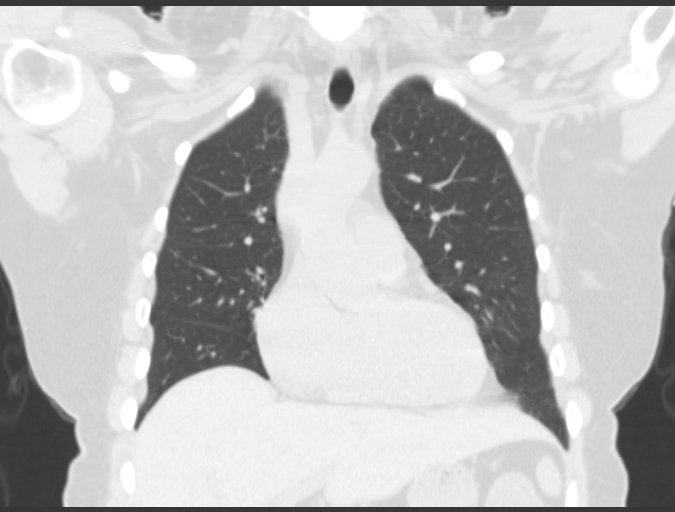
[im 50/83  lung]
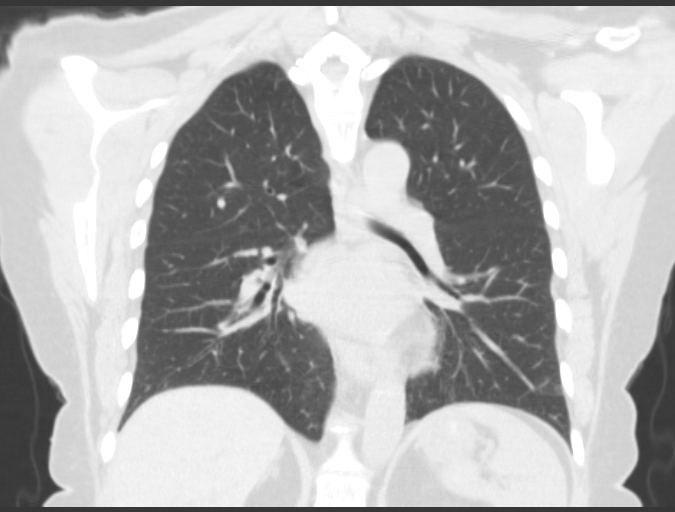

[15 of 36 positions shown; findings below may reference images not displayed]

FINDINGS: There is a 4 mm calcified lung nodule in the anterior inferior
aspect of the right upper lobe. It is stable from 02/24/2013. It is
associated with minimal surrounding fibrosis. This is seen best on
image number 37. On 39 in the right middle lobe there is a 2 mm
pulmonary nodule which is not noted on the prior study. The right
lung is otherwise clear. Left lung is clear except for minimal
subsegmental atelectasis in the inferolateral lingula.

No pleural or pericardial effusion. No significant adenopathy.
Minimal aortic arch calcification. Scans through the upper abdomen
unremarkable. No acute osseous abnormalities.
IMPRESSION: Stable 4 mm pulmonary nodule, with 12 month stability suggesting
benign etiology, a conclusion further supported by the calcification
of the nodule. There is a new 2 mm nodule on the right. If the
patient is at high risk for bronchogenic carcinoma, follow-up chest
CT at 1 year for this new nodule is recommended. If the patient is
at low risk, no follow-up is needed. This recommendation follows the
consensus statement: Guidelines for Management of Small Pulmonary
Nodules Detected on CT Scans: A Statement from the [employer]

## 2016-01-26 ENCOUNTER — Other Ambulatory Visit: Payer: Self-pay | Admitting: Internal Medicine

## 2016-01-26 DIAGNOSIS — R911 Solitary pulmonary nodule: Secondary | ICD-10-CM

## 2016-01-27 ENCOUNTER — Telehealth: Payer: Self-pay | Admitting: Internal Medicine

## 2016-01-27 NOTE — Telephone Encounter (Signed)
atc # provided X3, line would not ring, no fast busy signal, just silence X30 seconds, then line cut off.   Wcb.

## 2016-01-28 ENCOUNTER — Other Ambulatory Visit: Payer: Managed Care, Other (non HMO)

## 2016-01-28 NOTE — Telephone Encounter (Signed)
atc number provided X2, line would not go through. Attempted to dial [phone] instead of [phone] in case this was a typo- call could not go through as dialed.  Wcb.

## 2016-03-06 ENCOUNTER — Telehealth: Payer: Self-pay | Admitting: Internal Medicine

## 2016-03-06 NOTE — Telephone Encounter (Signed)
Called and spoke to pt. Informed her of the results per MR. Pt verbalized understanding and denied any further questions or concerns at this time.  

## 2016-03-06 NOTE — Telephone Encounter (Signed)
I am seeing her 05/04/16. Got reports from [facility] CT chest done 02/09/16 that RUL lun gnodule is unchanged since 2014 and considered bneign. And anohter one 2mm RML unchanged since 2015 and also benign

## 2016-04-24 ENCOUNTER — Encounter: Payer: Self-pay | Admitting: *Deleted

## 2016-04-28 ENCOUNTER — Ambulatory Visit: Payer: Managed Care, Other (non HMO) | Admitting: Interventional Cardiology

## 2016-05-04 ENCOUNTER — Encounter: Payer: Self-pay | Admitting: Internal Medicine

## 2016-05-04 ENCOUNTER — Ambulatory Visit (INDEPENDENT_AMBULATORY_CARE_PROVIDER_SITE_OTHER): Payer: Managed Care, Other (non HMO) | Admitting: Internal Medicine

## 2016-05-04 VITALS — BP 116/78 | HR 81 | Ht 68.5 in | Wt 192.0 lb

## 2016-05-04 DIAGNOSIS — R911 Solitary pulmonary nodule: Secondary | ICD-10-CM | POA: Diagnosis not present

## 2016-05-04 NOTE — Patient Instructions (Signed)
ICD-9-CM ICD-10-CM   1. Nodule of right lung 793.11 R91.1    Stable < 60mm lung nodule for years ASymptomatic No active followup Return as needed

## 2016-05-04 NOTE — Progress Notes (Signed)
Subjective:     Patient ID: April Summers, female   DOB: 1951-08-24, 65 y.o.   MRN: [id number]  HPI     OV 05/12/2014  Chief Complaint  Patient presents with  . Follow-up    Pt here to discuss CT results. Denies cough, SOB and CP. Pt overall is doing well.     Followup pulmonary nodule:  4 mm right upper lobe/right middle lobe. Had CT scan of the chest April 2015. This nodule is no unchanged compared to one year ago in 2014. In fact it is noncalcified. However she is a new 2 mm right middle lobe pulmonary nodule. I advised that she does not need follow up for this but if she is anxious about this/ She prefers a followup CT scan of the chest in 2 years. She is a nonsmoker. Her dyspnea is resolved and there are no new issues. She is completely asymptomatic.   COMPARISON: CT HEART MORP W/CTA COR W/SCORE W/CA W/CM &/OR WO/CM  dated 02/24/2013  FINDINGS:  There is a 4 mm calcified lung nodule in the anterior inferior  aspect of the right upper lobe. It is stable from 02/24/2013. It is  associated with minimal surrounding fibrosis. This is seen best on  image number 37. On 66 in the right middle lobe there is a 2 mm  pulmonary nodule which is not noted on the prior study. The right  lung is otherwise clear. Left lung is clear except for minimal  subsegmental atelectasis in the inferolateral lingula.  No pleural or pericardial effusion. No significant adenopathy.  Minimal aortic arch calcification. Scans through the upper abdomen  unremarkable. No acute osseous abnormalities.  IMPRESSION:  Stable 4 mm pulmonary nodule, with 12 month stability suggesting  benign etiology, a conclusion further supported by the calcification  of the nodule. There is a new 2 mm nodule on the right. If the  patient is at high risk for bronchogenic carcinoma, follow-up chest  CT at 1 year for this new nodule is recommended. If the patient is  at low risk, no follow-up is needed. This recommendation follows the   consensus statement: Guidelines for Management of Small Pulmonary  Nodules Detected on CT Scans: A Statement from the [place] as published in Radiology 2005; 237:395-400.  Electronically Signed  By: Skipper Cliche M.D.  On: 03/17/2014 10:14   OV 05/04/2016  Chief Complaint  Patient presents with  . Follow-up    Denies any current SOB. Review CT.    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 She has less than 4 mm nodules. She is a nonsmoker. She wanted a 2 year follow-up CT chest just to be sure that the lung nodules are not growing. She had this in the [place]. This is on by [facility]. I do not have the formal film with me but it was read by [facility] radiologist and the report is as below. I was not able to visualized image. She is asymptomatic. She went to [facility] in July 2016 to see Dr. Achilles Dunk for mass cell disorder which she believes is stable.      Got reports from [facility] CT chest done 02/09/16 that RUL lun gnodule is unchanged since 2014 and considered bneign. And anohter one 46mm RML unchanged since 2015 and also benign  EXAM:  CT CHEST WITHOUT CONTRAST    TECHNIQUE:  Multidetector CT imaging of the chest was performed following the  standard protocol without IV contrast.  COMPARISON:Chest CT 03/17/2014 and 02/24/2013    FINDINGS:  Mediastinum/Nodes: No breast masses, supraclavicular or axillary  lymphadenopathy. Small scattered lymph nodes are noted. The heart is  normal in size. No pericardial effusion. The aorta is normal in  caliber. No significant atherosclerotic calcifications.    No mediastinal or hilar mass or adenopathy. Small scattered lymph  nodes are stable. The esophagus is grossly normal.    Lungs/Pleura: Stable linear calcified density in the right lower  lobe on image number 39. There is also a stable 4 mm subpleural  right lower lobe pulmonary nodule on image number 39. No new   pulmonary lesions. No acute pulmonary findings. No pleural effusion.  Minimal lingular scarring changes.    Upper abdomen: No significant findings.    Musculoskeletal: No significant findings. Multilevel Schmorl's nodes  are noted in the thoracic spine.    IMPRESSION:  1. Stable linear calcified density in the right upper lobe,  unchanged since 2014 and considered benign.  2. Stable 4 mm subpleural pulmonary nodule in the right middle lobe  unchanged since 2015 and also considered benign.  3. No acute pulmonary findings.  4. No mediastinal or hilar mass or lymphadenopathy.      Electronically Signed  ByMarijo Sanes M.D.  On: 02/09/2016 11:56    has a past medical history of Nonspecific abnormal unspecified cardiovascular function study; Chest pain, unspecified; HTN (hypertension); Mast cell disorder; and Benign meningioma ([place]).   reports that she has never smoked. She does not have any smokeless tobacco history on file.  Past Surgical History  Procedure Laterality Date  . Craniotomy    . Tear duct probing    . Total abdominal hysterectomy    . Tubal ligation      Allergies  Allergen Reactions  . Lidocaine Hcl Palpitations  . Blue Dyes (Parenteral)     Mouth blisters  . Ciprofloxacin Other (See Comments)    Wheezing  . Doxycycline Other (See Comments)    Burning feeling all over  . Imdur [Isosorbide Nitrate]     Long acting causes migraines  . Nitrostat [Nitroglycerin]     Long acting cause migraines  . Red Dye     Mouth blisters  . Benzocaine Palpitations    Immunization History  Administered Date(s) Administered  . Influenza Split 09/10/2013    Family History  Problem Relation Age of Onset  . Heart disease Father   . Clotting disorder Brother   . Heart attack Father   . Hypertension Father   . Stroke Maternal Grandfather      Current outpatient prescriptions:  .  carvedilol (COREG) 3.125 MG tablet, Take 3.125 mg by  mouth 2 (two) times daily with a meal., Disp: , Rfl:     Review of Systems     Objective:   Physical Exam Filed Vitals:   05/04/16 1417  BP: 116/78  Pulse: 81  Height: 5' 8.5" (1.74 m)  Weight: 192 lb (87.091 kg)  SpO2: 100%   Focal exam - discussio main -oriented x 3. Normal gait. Pleasant . CTA [place]. Normal heart sounds     Assessment:       ICD-9-CM ICD-10-CM   1. Nodule of right lung 793.11 R91.1        Plan:      Stable < 34mm lung nodule for years ASymptomatic No active followup Return as needed   Greater than 50% of this brief 10 minutes visit spent in counseling and face-to-face discussion and coordination of care  Dr. Brand Males, M.D., [facility].C.P Pulmonary and Critical Care Medicine Staff Physician [facility] Pager: [phone], If no answer or between  15:00h - 7:00h: call [phone]  05/04/2016 2:32 PM

## 2016-06-27 ENCOUNTER — Ambulatory Visit (INDEPENDENT_AMBULATORY_CARE_PROVIDER_SITE_OTHER): Payer: Managed Care, Other (non HMO) | Admitting: Interventional Cardiology

## 2016-06-27 ENCOUNTER — Encounter: Payer: Self-pay | Admitting: *Deleted

## 2016-06-27 ENCOUNTER — Encounter: Payer: Self-pay | Admitting: Interventional Cardiology

## 2016-06-27 ENCOUNTER — Encounter (INDEPENDENT_AMBULATORY_CARE_PROVIDER_SITE_OTHER): Payer: Self-pay

## 2016-06-27 VITALS — BP 150/97 | HR 80 | Ht 68.5 in | Wt 197.0 lb

## 2016-06-27 DIAGNOSIS — Q245 Malformation of coronary vessels: Secondary | ICD-10-CM

## 2016-06-27 DIAGNOSIS — R002 Palpitations: Secondary | ICD-10-CM

## 2016-06-27 DIAGNOSIS — I4949 Other premature depolarization: Secondary | ICD-10-CM

## 2016-06-27 NOTE — Progress Notes (Signed)
Patient ID: April Summers, female   DOB: 01/18/1951, 65 y.o.   MRN: [id number] Patient enrolled for [employer] to mail a Verite 30 day cardiac event monitor to the home.

## 2016-06-27 NOTE — Patient Instructions (Signed)
Medication Instructions:  Same-no changes  Labwork: None  Testing/Procedures: Your physician has recommended that you wear a 30 day event monitor. Event monitors are medical devices that record the heart's electrical activity. Doctors most often [place] these monitors to diagnose arrhythmias. Arrhythmias are problems with the speed or rhythm of the heartbeat. The monitor is a small, portable device. You can wear one while you do your normal daily activities. This is usually used to diagnose what is causing palpitations/syncope (passing out).   Follow-Up: Your physician recommends that you schedule a follow-up appointment in: based on results of event monitor.     If you need a refill on your cardiac medications before your next appointment, please call your pharmacy.

## 2016-06-27 NOTE — Progress Notes (Signed)
Cardiology Office Note   Date:  06/27/2016   ID:  April Summers, DOB 08-04-1951, MRN [id number]  PCP:  Orpah Melter, MD    No chief complaint on file.    Wt Readings from Last 3 Encounters:  06/27/16 197 lb (89.359 kg)  05/04/16 192 lb (87.091 kg)  05/12/14 201 lb 6.4 oz (91.354 kg)       History of Present Illness: April Summers is a 65 y.o. 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 She has been managed medically. Overall, she had been doing well.  Over the last few months, she has noticed that when she works outside, her heart rate will increase and it takes a very long time for it to come back down to baseline. Sometimes it will be several hours for heart rate comes back to baseline. Her heart rate can be stuck anywhere from the 90s to 110 range. This occurs even if she lies down after activity. Typically, trimming edges where she is using her arms will stimulate this heart rate. She denies any chest pain. She still feels isolated premature beats. One such beat occurred during the exam today and she noticed it. She also has strings of these beats that lasts anywhere from 5-7 seconds. No lightheadedness or syncope.    Past Medical History  Diagnosis Date  . Nonspecific abnormal unspecified cardiovascular function study   . Chest pain, unspecified   . HTN (hypertension)   . Mast cell disorder   . Benign meningioma [facility])     Past Surgical History  Procedure Laterality Date  . Craniotomy    . Tear duct probing    . Total abdominal hysterectomy    . Tubal ligation       Current Outpatient Prescriptions  Medication Sig Dispense Refill  . Acetylcysteine (NAC) 500 MG CAPS Take 500 mg by mouth 2 (two) times daily.    . Artificial Tear Solution (TEARS NATURALE OP) Apply 1 drop to eye 2 (two) times daily.     . Cholecalciferol 1000 UNT/0.03ML LIQD Take 1 tablet by mouth daily.       . diphenhydrAMINE (BENADRYL) 25 MG tablet Take 25 mg by mouth 3 (three) times daily.    . famotidine (PEPCID) 20 MG tablet Take 20 mg by mouth at bedtime.     Marland Kitchen HYDROcodone-acetaminophen (NORCO/VICODIN) 5-325 MG tablet Take 1 tablet by mouth every 6 (six) hours as needed for severe pain.     Marland Kitchen KETOTIFEN FUMARATE OP Apply 1 mg to eye 2 (two) times daily.    . traMADol (ULTRAM) 50 MG tablet Take 50 mg by mouth as needed for moderate pain or severe pain.     . Sodium Chloride-Sodium Bicarb 1.57 g PACK Reported on 06/27/2016     No current facility-administered medications for this visit.    Allergies:   Blue dyes (parenteral); Cimetidine; Ciprofloxacin; Doxycycline; Imdur; Lidocaine; Lidocaine hcl; Nitrostat; Nsaids; Red dye; Fluocinolone; and Benzocaine    Social History:  The patient  reports that she has never smoked. She does not have any smokeless tobacco history on file. She reports that she does not drink alcohol or use illicit drugs.   Family History:  The patient's family history includes Clotting disorder in her brother; Heart attack in her father; Heart disease in her father; Hypertension in her father; Stroke in her maternal grandfather.    ROS:  Please see the history  of present illness.   Otherwise, review of systems are positive for [place] improved.   All other systems are reviewed and negative.    PHYSICAL EXAM: VS:  BP 150/97 mmHg  Pulse 80  Ht 5' 8.5" (1.74 m)  Wt 197 lb (89.359 kg)  BMI 29.51 kg/m2 , BMI Body mass index is 29.51 kg/(m^2). GEN: Well nourished, well developed, in no acute distress HEENT: normal Neck: no JVD, carotid bruits, or masses Cardiac: RRR; no murmurs, rubs, or gallops,no edema  Respiratory:  clear to auscultation bilaterally, normal work of breathing GI: soft, nontender, nondistended, + BS MS: no deformity or atrophy Skin: warm and dry, no rash Neuro:  Strength and sensation are intact Psych: euthymic mood, full affect   EKG:   The ekg  ordered today demonstrates normal sinus rhythm, RSR prime, no ST segment changes   Recent Labs: No results found for requested labs within last 365 days.   Lipid Panel No results found for: CHOL, TRIG, HDL, CHOLHDL, VLDL, LDLCALC, LDLDIRECT   Other studies Reviewed: Additional studies/ records that were reviewed today with results demonstrating: prior cath report reviewed.   ASSESSMENT AND PLAN:  1. Palpitations: Plan for 30 day event monitor to further delineate whether her palpitations are related to PVCs or a more sustained arrhythmia. She states there is some variability to the heart rate even when it is going fast after exercise. This makes it less likely that it is some type of SVT. 2. Premature beats: She recognizes these symptoms quite well. 3. Known coronary artery fistula. She was evaluated at [facility] and medical therapy was recommended. I don't think this is contributing to her palpitations.   Current medicines are reviewed at length with the patient today.  The patient concerns regarding her medicines were addressed.  The following changes have been made:  No change  Labs/ tests ordered today include:  No orders of the defined types were placed in this encounter.    Recommend 150 minutes/week of aerobic exercise Low fat, low carb, high fiber diet recommended  Disposition:   FU in post monitor   Signed, Larae Grooms, MD  06/27/2016 2:58 PM    [address], [place], [place]  [address] Phone: [phone]; Fax: [phone]

## 2016-07-04 NOTE — Addendum Note (Signed)
Addended by: Freada Bergeron on: 07/04/2016 05:44 PM   Modules accepted: Orders

## 2016-07-06 NOTE — Addendum Note (Signed)
Addended by: Freada Bergeron on: 07/06/2016 12:44 PM   Modules accepted: Orders

## 2018-03-18 DIAGNOSIS — G43909 Migraine, unspecified, not intractable, without status migrainosus: Secondary | ICD-10-CM | POA: Diagnosis not present

## 2018-03-18 DIAGNOSIS — Z5181 Encounter for therapeutic drug level monitoring: Secondary | ICD-10-CM | POA: Diagnosis not present

## 2018-03-18 DIAGNOSIS — M1990 Unspecified osteoarthritis, unspecified site: Secondary | ICD-10-CM | POA: Diagnosis not present

## 2018-09-17 DIAGNOSIS — G43909 Migraine, unspecified, not intractable, without status migrainosus: Secondary | ICD-10-CM | POA: Diagnosis not present

## 2018-09-17 DIAGNOSIS — D8940 Mast cell activation, unspecified: Secondary | ICD-10-CM | POA: Diagnosis not present

## 2018-09-17 DIAGNOSIS — M1990 Unspecified osteoarthritis, unspecified site: Secondary | ICD-10-CM | POA: Diagnosis not present

## 2018-09-17 DIAGNOSIS — M5430 Sciatica, unspecified side: Secondary | ICD-10-CM | POA: Diagnosis not present

## 2019-03-18 DIAGNOSIS — G43909 Migraine, unspecified, not intractable, without status migrainosus: Secondary | ICD-10-CM | POA: Diagnosis not present

## 2019-03-18 DIAGNOSIS — G894 Chronic pain syndrome: Secondary | ICD-10-CM | POA: Diagnosis not present

## 2019-08-14 DIAGNOSIS — G894 Chronic pain syndrome: Secondary | ICD-10-CM | POA: Diagnosis not present

## 2019-08-14 DIAGNOSIS — Z1211 Encounter for screening for malignant neoplasm of colon: Secondary | ICD-10-CM | POA: Diagnosis not present

## 2019-08-14 DIAGNOSIS — Z0000 Encounter for general adult medical examination without abnormal findings: Secondary | ICD-10-CM | POA: Diagnosis not present

## 2019-08-14 DIAGNOSIS — Z13220 Encounter for screening for lipoid disorders: Secondary | ICD-10-CM | POA: Diagnosis not present

## 2019-08-14 DIAGNOSIS — G44229 Chronic tension-type headache, not intractable: Secondary | ICD-10-CM | POA: Diagnosis not present

## 2019-08-14 DIAGNOSIS — E7800 Pure hypercholesterolemia, unspecified: Secondary | ICD-10-CM | POA: Diagnosis not present

## 2019-08-14 DIAGNOSIS — Z1231 Encounter for screening mammogram for malignant neoplasm of breast: Secondary | ICD-10-CM | POA: Diagnosis not present

## 2019-09-17 ENCOUNTER — Encounter: Payer: Self-pay | Admitting: *Deleted

## 2020-12-24 DIAGNOSIS — M25561 Pain in right knee: Secondary | ICD-10-CM | POA: Diagnosis not present

## 2021-01-07 DIAGNOSIS — M1990 Unspecified osteoarthritis, unspecified site: Secondary | ICD-10-CM | POA: Diagnosis not present

## 2021-01-07 DIAGNOSIS — Z0000 Encounter for general adult medical examination without abnormal findings: Secondary | ICD-10-CM | POA: Diagnosis not present

## 2021-01-07 DIAGNOSIS — R059 Cough, unspecified: Secondary | ICD-10-CM | POA: Diagnosis not present

## 2021-01-07 DIAGNOSIS — G894 Chronic pain syndrome: Secondary | ICD-10-CM | POA: Diagnosis not present

## 2021-03-04 DIAGNOSIS — I129 Hypertensive chronic kidney disease with stage 1 through stage 4 chronic kidney disease, or unspecified chronic kidney disease: Secondary | ICD-10-CM | POA: Diagnosis not present

## 2021-03-04 DIAGNOSIS — I10 Essential (primary) hypertension: Secondary | ICD-10-CM | POA: Diagnosis not present

## 2021-03-04 DIAGNOSIS — M1990 Unspecified osteoarthritis, unspecified site: Secondary | ICD-10-CM | POA: Diagnosis not present

## 2021-03-28 DIAGNOSIS — I10 Essential (primary) hypertension: Secondary | ICD-10-CM | POA: Diagnosis not present

## 2021-03-28 DIAGNOSIS — I129 Hypertensive chronic kidney disease with stage 1 through stage 4 chronic kidney disease, or unspecified chronic kidney disease: Secondary | ICD-10-CM | POA: Diagnosis not present

## 2021-03-28 DIAGNOSIS — M1990 Unspecified osteoarthritis, unspecified site: Secondary | ICD-10-CM | POA: Diagnosis not present

## 2021-07-14 DIAGNOSIS — G8929 Other chronic pain: Secondary | ICD-10-CM | POA: Diagnosis not present

## 2021-07-14 DIAGNOSIS — M2550 Pain in unspecified joint: Secondary | ICD-10-CM | POA: Diagnosis not present

## 2021-08-10 DIAGNOSIS — G8929 Other chronic pain: Secondary | ICD-10-CM | POA: Diagnosis not present

## 2021-08-10 DIAGNOSIS — I10 Essential (primary) hypertension: Secondary | ICD-10-CM | POA: Diagnosis not present

## 2021-08-10 DIAGNOSIS — I129 Hypertensive chronic kidney disease with stage 1 through stage 4 chronic kidney disease, or unspecified chronic kidney disease: Secondary | ICD-10-CM | POA: Diagnosis not present

## 2021-08-10 DIAGNOSIS — M1990 Unspecified osteoarthritis, unspecified site: Secondary | ICD-10-CM | POA: Diagnosis not present

## 2022-01-09 DIAGNOSIS — M1990 Unspecified osteoarthritis, unspecified site: Secondary | ICD-10-CM | POA: Diagnosis not present

## 2022-01-09 DIAGNOSIS — G894 Chronic pain syndrome: Secondary | ICD-10-CM | POA: Diagnosis not present

## 2022-01-09 DIAGNOSIS — Z0000 Encounter for general adult medical examination without abnormal findings: Secondary | ICD-10-CM | POA: Diagnosis not present

## 2022-01-09 DIAGNOSIS — G43909 Migraine, unspecified, not intractable, without status migrainosus: Secondary | ICD-10-CM | POA: Diagnosis not present

## 2022-02-28 DIAGNOSIS — D320 Benign neoplasm of cerebral meninges: Secondary | ICD-10-CM | POA: Diagnosis not present

## 2022-02-28 DIAGNOSIS — Z79899 Other long term (current) drug therapy: Secondary | ICD-10-CM | POA: Diagnosis not present

## 2022-02-28 DIAGNOSIS — R519 Headache, unspecified: Secondary | ICD-10-CM | POA: Diagnosis not present

## 2022-02-28 DIAGNOSIS — Z923 Personal history of irradiation: Secondary | ICD-10-CM | POA: Diagnosis not present

## 2022-07-10 DIAGNOSIS — R030 Elevated blood-pressure reading, without diagnosis of hypertension: Secondary | ICD-10-CM | POA: Diagnosis not present

## 2022-07-10 DIAGNOSIS — G894 Chronic pain syndrome: Secondary | ICD-10-CM | POA: Diagnosis not present

## 2023-01-09 DIAGNOSIS — G894 Chronic pain syndrome: Secondary | ICD-10-CM | POA: Diagnosis not present

## 2023-08-21 DIAGNOSIS — I10 Essential (primary) hypertension: Secondary | ICD-10-CM | POA: Diagnosis not present

## 2023-08-21 DIAGNOSIS — D329 Benign neoplasm of meninges, unspecified: Secondary | ICD-10-CM | POA: Diagnosis not present

## 2023-08-21 DIAGNOSIS — G894 Chronic pain syndrome: Secondary | ICD-10-CM | POA: Diagnosis not present

## 2023-08-21 DIAGNOSIS — G43909 Migraine, unspecified, not intractable, without status migrainosus: Secondary | ICD-10-CM | POA: Diagnosis not present

## 2023-08-21 DIAGNOSIS — Z0000 Encounter for general adult medical examination without abnormal findings: Secondary | ICD-10-CM | POA: Diagnosis not present

## 2024-01-28 DIAGNOSIS — E034 Atrophy of thyroid (acquired): Secondary | ICD-10-CM | POA: Diagnosis not present

## 2024-01-28 DIAGNOSIS — E042 Nontoxic multinodular goiter: Secondary | ICD-10-CM | POA: Diagnosis not present

## 2024-02-26 DIAGNOSIS — G43909 Migraine, unspecified, not intractable, without status migrainosus: Secondary | ICD-10-CM | POA: Diagnosis not present

## 2024-02-26 DIAGNOSIS — R030 Elevated blood-pressure reading, without diagnosis of hypertension: Secondary | ICD-10-CM | POA: Diagnosis not present

## 2024-02-26 DIAGNOSIS — G894 Chronic pain syndrome: Secondary | ICD-10-CM | POA: Diagnosis not present

## 2024-02-26 DIAGNOSIS — D329 Benign neoplasm of meninges, unspecified: Secondary | ICD-10-CM | POA: Diagnosis not present

## 2024-02-29 DIAGNOSIS — Z923 Personal history of irradiation: Secondary | ICD-10-CM | POA: Diagnosis not present

## 2024-02-29 DIAGNOSIS — G9389 Other specified disorders of brain: Secondary | ICD-10-CM | POA: Diagnosis not present

## 2024-02-29 DIAGNOSIS — D320 Benign neoplasm of cerebral meninges: Secondary | ICD-10-CM | POA: Diagnosis not present

## 2024-03-07 DIAGNOSIS — D320 Benign neoplasm of cerebral meninges: Secondary | ICD-10-CM | POA: Diagnosis not present

## 2024-03-07 DIAGNOSIS — D329 Benign neoplasm of meninges, unspecified: Secondary | ICD-10-CM | POA: Diagnosis not present

## 2024-03-13 DIAGNOSIS — D320 Benign neoplasm of cerebral meninges: Secondary | ICD-10-CM | POA: Diagnosis not present

## 2024-03-17 DIAGNOSIS — D320 Benign neoplasm of cerebral meninges: Secondary | ICD-10-CM | POA: Diagnosis not present

## 2024-03-18 DIAGNOSIS — D320 Benign neoplasm of cerebral meninges: Secondary | ICD-10-CM | POA: Diagnosis not present

## 2024-03-19 DIAGNOSIS — D320 Benign neoplasm of cerebral meninges: Secondary | ICD-10-CM | POA: Diagnosis not present

## 2024-03-20 DIAGNOSIS — D320 Benign neoplasm of cerebral meninges: Secondary | ICD-10-CM | POA: Diagnosis not present

## 2024-03-21 DIAGNOSIS — D429 Neoplasm of uncertain behavior of meninges, unspecified: Secondary | ICD-10-CM | POA: Diagnosis not present

## 2024-03-21 DIAGNOSIS — D320 Benign neoplasm of cerebral meninges: Secondary | ICD-10-CM | POA: Diagnosis not present

## 2024-03-21 DIAGNOSIS — D329 Benign neoplasm of meninges, unspecified: Secondary | ICD-10-CM | POA: Diagnosis not present

## 2024-07-23 DIAGNOSIS — D329 Benign neoplasm of meninges, unspecified: Secondary | ICD-10-CM | POA: Diagnosis not present

## 2024-07-23 DIAGNOSIS — G894 Chronic pain syndrome: Secondary | ICD-10-CM | POA: Diagnosis not present

## 2024-07-23 DIAGNOSIS — G43909 Migraine, unspecified, not intractable, without status migrainosus: Secondary | ICD-10-CM | POA: Diagnosis not present

## 2024-07-23 DIAGNOSIS — Z6827 Body mass index (BMI) 27.0-27.9, adult: Secondary | ICD-10-CM | POA: Diagnosis not present

## 2024-07-29 DIAGNOSIS — G939 Disorder of brain, unspecified: Secondary | ICD-10-CM | POA: Diagnosis not present

## 2024-07-29 DIAGNOSIS — D329 Benign neoplasm of meninges, unspecified: Secondary | ICD-10-CM | POA: Diagnosis not present

## 2024-08-01 DIAGNOSIS — D329 Benign neoplasm of meninges, unspecified: Secondary | ICD-10-CM | POA: Diagnosis not present

## 2024-08-04 DIAGNOSIS — Z6827 Body mass index (BMI) 27.0-27.9, adult: Secondary | ICD-10-CM | POA: Diagnosis not present

## 2024-08-04 DIAGNOSIS — G43909 Migraine, unspecified, not intractable, without status migrainosus: Secondary | ICD-10-CM | POA: Diagnosis not present

## 2024-08-04 DIAGNOSIS — R410 Disorientation, unspecified: Secondary | ICD-10-CM | POA: Diagnosis not present

## 2024-08-04 DIAGNOSIS — D329 Benign neoplasm of meninges, unspecified: Secondary | ICD-10-CM | POA: Diagnosis not present

## 2024-08-06 DIAGNOSIS — R410 Disorientation, unspecified: Secondary | ICD-10-CM | POA: Diagnosis not present

## 2024-08-06 DIAGNOSIS — I6521 Occlusion and stenosis of right carotid artery: Secondary | ICD-10-CM | POA: Diagnosis not present

## 2024-08-13 DIAGNOSIS — R410 Disorientation, unspecified: Secondary | ICD-10-CM | POA: Diagnosis not present

## 2024-08-13 DIAGNOSIS — Z6827 Body mass index (BMI) 27.0-27.9, adult: Secondary | ICD-10-CM | POA: Diagnosis not present

## 2024-08-13 DIAGNOSIS — I10 Essential (primary) hypertension: Secondary | ICD-10-CM | POA: Diagnosis not present

## 2024-09-23 DIAGNOSIS — M79642 Pain in left hand: Secondary | ICD-10-CM | POA: Diagnosis not present

## 2024-09-23 DIAGNOSIS — R29898 Other symptoms and signs involving the musculoskeletal system: Secondary | ICD-10-CM | POA: Diagnosis not present

## 2024-09-24 DIAGNOSIS — H02403 Unspecified ptosis of bilateral eyelids: Secondary | ICD-10-CM | POA: Diagnosis not present

## 2024-09-24 DIAGNOSIS — H527 Unspecified disorder of refraction: Secondary | ICD-10-CM | POA: Diagnosis not present

## 2024-09-24 DIAGNOSIS — H02831 Dermatochalasis of right upper eyelid: Secondary | ICD-10-CM | POA: Diagnosis not present

## 2024-09-24 DIAGNOSIS — H25813 Combined forms of age-related cataract, bilateral: Secondary | ICD-10-CM | POA: Diagnosis not present

## 2024-09-24 DIAGNOSIS — H47322 Drusen of optic disc, left eye: Secondary | ICD-10-CM | POA: Diagnosis not present

## 2024-09-24 DIAGNOSIS — H02834 Dermatochalasis of left upper eyelid: Secondary | ICD-10-CM | POA: Diagnosis not present

## 2024-09-29 DIAGNOSIS — I10 Essential (primary) hypertension: Secondary | ICD-10-CM | POA: Diagnosis not present
